# Patient Record
Sex: Male | Born: 1950 | Race: Black or African American | Hispanic: No | Marital: Married | Smoking: Current some day smoker
Health system: Southern US, Community
[De-identification: ages and names within clinical notes are randomized; demographics above are authoritative.]

## PROBLEM LIST (undated history)

## (undated) DIAGNOSIS — E785 Hyperlipidemia, unspecified: Secondary | ICD-10-CM

## (undated) HISTORY — PX: LIGAMENT REPAIR: SHX5444

## (undated) HISTORY — PX: FINGER SURGERY: SHX640

## (undated) HISTORY — PX: COLONOSCOPY: SHX174

---

## 2018-03-20 ENCOUNTER — Encounter (HOSPITAL_COMMUNITY): Payer: Self-pay | Admitting: *Deleted

## 2018-03-20 ENCOUNTER — Emergency Department (HOSPITAL_COMMUNITY): Payer: Medicare Other

## 2018-03-20 ENCOUNTER — Inpatient Hospital Stay (HOSPITAL_COMMUNITY)
Admission: EM | Admit: 2018-03-20 | Discharge: 2018-03-23 | DRG: 184 | Disposition: A | Discharge status: Home / Self Care | Payer: Medicare Other | Attending: Surgery | Admitting: Surgery

## 2018-03-20 ENCOUNTER — Other Ambulatory Visit: Payer: Self-pay

## 2018-03-20 DIAGNOSIS — T1490XA Injury, unspecified, initial encounter: Secondary | ICD-10-CM | POA: Diagnosis not present

## 2018-03-20 DIAGNOSIS — S39091A Other injury of muscle, fascia and tendon of abdomen, initial encounter: Secondary | ICD-10-CM | POA: Diagnosis present

## 2018-03-20 DIAGNOSIS — S42191A Fracture of other part of scapula, right shoulder, initial encounter for closed fracture: Secondary | ICD-10-CM | POA: Diagnosis present

## 2018-03-20 DIAGNOSIS — S2243XA Multiple fractures of ribs, bilateral, initial encounter for closed fracture: Principal | ICD-10-CM

## 2018-03-20 DIAGNOSIS — S32038A Other fracture of third lumbar vertebra, initial encounter for closed fracture: Secondary | ICD-10-CM | POA: Diagnosis present

## 2018-03-20 DIAGNOSIS — S2242XA Multiple fractures of ribs, left side, initial encounter for closed fracture: Secondary | ICD-10-CM

## 2018-03-20 DIAGNOSIS — S50811A Abrasion of right forearm, initial encounter: Secondary | ICD-10-CM | POA: Diagnosis present

## 2018-03-20 DIAGNOSIS — S32028A Other fracture of second lumbar vertebra, initial encounter for closed fracture: Secondary | ICD-10-CM | POA: Diagnosis present

## 2018-03-20 DIAGNOSIS — T07XXXA Unspecified multiple injuries, initial encounter: Secondary | ICD-10-CM

## 2018-03-20 DIAGNOSIS — F17210 Nicotine dependence, cigarettes, uncomplicated: Secondary | ICD-10-CM | POA: Diagnosis present

## 2018-03-20 DIAGNOSIS — S2249XA Multiple fractures of ribs, unspecified side, initial encounter for closed fracture: Secondary | ICD-10-CM | POA: Diagnosis present

## 2018-03-20 DIAGNOSIS — S0240CA Maxillary fracture, right side, initial encounter for closed fracture: Secondary | ICD-10-CM | POA: Diagnosis present

## 2018-03-20 DIAGNOSIS — S0081XA Abrasion of other part of head, initial encounter: Secondary | ICD-10-CM | POA: Diagnosis present

## 2018-03-20 DIAGNOSIS — S32018A Other fracture of first lumbar vertebra, initial encounter for closed fracture: Secondary | ICD-10-CM | POA: Diagnosis present

## 2018-03-20 DIAGNOSIS — S0231XA Fracture of orbital floor, right side, initial encounter for closed fracture: Secondary | ICD-10-CM | POA: Diagnosis present

## 2018-03-20 DIAGNOSIS — S0240EA Zygomatic fracture, right side, initial encounter for closed fracture: Secondary | ICD-10-CM | POA: Diagnosis present

## 2018-03-20 DIAGNOSIS — T797XXA Traumatic subcutaneous emphysema, initial encounter: Secondary | ICD-10-CM | POA: Diagnosis present

## 2018-03-20 HISTORY — DX: Hyperlipidemia, unspecified: E78.5

## 2018-03-20 LAB — COMPREHENSIVE METABOLIC PANEL
ALK PHOS: 56 U/L (ref 38–126)
ALT: 58 U/L — AB (ref 0–44)
AST: 107 U/L — AB (ref 15–41)
Albumin: 4 g/dL (ref 3.5–5.0)
Anion gap: 14 (ref 5–15)
BILIRUBIN TOTAL: 0.8 mg/dL (ref 0.3–1.2)
BUN: 12 mg/dL (ref 8–23)
CALCIUM: 9.5 mg/dL (ref 8.9–10.3)
CHLORIDE: 105 mmol/L (ref 98–111)
CO2: 21 mmol/L — ABNORMAL LOW (ref 22–32)
CREATININE: 1.33 mg/dL — AB (ref 0.61–1.24)
GFR calc non Af Amer: 53 mL/min — ABNORMAL LOW (ref >60)
Glucose, Bld: 124 mg/dL — ABNORMAL HIGH (ref 70–99)
Potassium: 3.4 mmol/L — ABNORMAL LOW (ref 3.5–5.1)
Sodium: 140 mmol/L (ref 135–145)
Total Protein: 7 g/dL (ref 6.5–8.1)

## 2018-03-20 LAB — I-STAT CG4 LACTIC ACID, ED: Lactic Acid, Venous: 3.88 mmol/L (ref 0.5–1.9)

## 2018-03-20 LAB — I-STAT CHEM 8, ED
BUN: 15 mg/dL (ref 8–23)
CHLORIDE: 105 mmol/L (ref 98–111)
CREATININE: 1.4 mg/dL — AB (ref 0.61–1.24)
Calcium, Ion: 1.11 mmol/L — ABNORMAL LOW (ref 1.15–1.40)
GLUCOSE: 118 mg/dL — AB (ref 70–99)
HEMATOCRIT: 42 % (ref 39.0–52.0)
HEMOGLOBIN: 14.3 g/dL (ref 13.0–17.0)
Potassium: 3.4 mmol/L — ABNORMAL LOW (ref 3.5–5.1)
Sodium: 139 mmol/L (ref 135–145)
TCO2: 21 mmol/L — ABNORMAL LOW (ref 22–32)

## 2018-03-20 LAB — CBC
HCT: 39.7 % (ref 39.0–52.0)
Hemoglobin: 13 g/dL (ref 13.0–17.0)
MCH: 31 pg (ref 26.0–34.0)
MCHC: 32.7 g/dL (ref 30.0–36.0)
MCV: 94.5 fL (ref 78.0–100.0)
PLATELETS: 188 10*3/uL (ref 150–400)
RBC: 4.2 MIL/uL — ABNORMAL LOW (ref 4.22–5.81)
RDW: 14.5 % (ref 11.5–15.5)
WBC: 9.2 10*3/uL (ref 4.0–10.5)

## 2018-03-20 LAB — SAMPLE TO BLOOD BANK

## 2018-03-20 LAB — PROTIME-INR
INR: 0.98
Prothrombin Time: 12.9 seconds (ref 11.4–15.2)

## 2018-03-20 LAB — CDS SEROLOGY

## 2018-03-20 LAB — ETHANOL: ALCOHOL ETHYL (B): 124 mg/dL — AB (ref <10)

## 2018-03-20 MED ORDER — FENTANYL CITRATE (PF) 100 MCG/2ML IJ SOLN
50.0000 ug | Freq: Once | INTRAMUSCULAR | Status: AC
  Administered 2018-03-20: 50 ug via INTRAVENOUS

## 2018-03-20 MED ORDER — LIDOCAINE-EPINEPHRINE (PF) 2 %-1:200000 IJ SOLN
20.0000 mL | Freq: Once | INTRAMUSCULAR | Status: AC
  Administered 2018-03-20: 20 mL

## 2018-03-20 MED ORDER — CEFAZOLIN SODIUM-DEXTROSE 2-4 GM/100ML-% IV SOLN
2.0000 g | Freq: Once | INTRAVENOUS | Status: AC
  Administered 2018-03-20: 2 g via INTRAVENOUS

## 2018-03-20 MED ORDER — FENTANYL CITRATE (PF) 100 MCG/2ML IJ SOLN
INTRAMUSCULAR | Status: AC
  Filled 2018-03-20: qty 2

## 2018-03-20 MED ORDER — IOPAMIDOL (ISOVUE-300) INJECTION 61%
100.0000 mL | Freq: Once | INTRAVENOUS | Status: AC | PRN
  Administered 2018-03-20: 100 mL via INTRAVENOUS

## 2018-03-20 MED ORDER — IOPAMIDOL (ISOVUE-300) INJECTION 61%
INTRAVENOUS | Status: AC
  Filled 2018-03-20: qty 100

## 2018-03-20 MED ORDER — SODIUM CHLORIDE 0.9 % IV SOLN
Freq: Once | INTRAVENOUS | Status: AC
  Administered 2018-03-20: 21:00:00 via INTRAVENOUS
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
## 2018-03-21 ENCOUNTER — Encounter (HOSPITAL_COMMUNITY): Payer: Self-pay | Admitting: General Practice

## 2018-03-21 ENCOUNTER — Other Ambulatory Visit: Payer: Self-pay

## 2018-03-21 ENCOUNTER — Inpatient Hospital Stay (HOSPITAL_COMMUNITY): Payer: Medicare Other

## 2018-03-21 DIAGNOSIS — S0081XA Abrasion of other part of head, initial encounter: Secondary | ICD-10-CM | POA: Diagnosis present

## 2018-03-21 DIAGNOSIS — S0240EA Zygomatic fracture, right side, initial encounter for closed fracture: Secondary | ICD-10-CM | POA: Diagnosis present

## 2018-03-21 DIAGNOSIS — S2249XA Multiple fractures of ribs, unspecified side, initial encounter for closed fracture: Secondary | ICD-10-CM | POA: Diagnosis present

## 2018-03-21 DIAGNOSIS — S50811A Abrasion of right forearm, initial encounter: Secondary | ICD-10-CM | POA: Diagnosis present

## 2018-03-21 DIAGNOSIS — S2243XA Multiple fractures of ribs, bilateral, initial encounter for closed fracture: Secondary | ICD-10-CM | POA: Diagnosis present

## 2018-03-21 DIAGNOSIS — T1490XA Injury, unspecified, initial encounter: Secondary | ICD-10-CM | POA: Diagnosis present

## 2018-03-21 DIAGNOSIS — S39091A Other injury of muscle, fascia and tendon of abdomen, initial encounter: Secondary | ICD-10-CM | POA: Diagnosis present

## 2018-03-21 DIAGNOSIS — S0231XA Fracture of orbital floor, right side, initial encounter for closed fracture: Secondary | ICD-10-CM | POA: Diagnosis present

## 2018-03-21 DIAGNOSIS — S0240CA Maxillary fracture, right side, initial encounter for closed fracture: Secondary | ICD-10-CM | POA: Diagnosis present

## 2018-03-21 DIAGNOSIS — T797XXA Traumatic subcutaneous emphysema, initial encounter: Secondary | ICD-10-CM | POA: Diagnosis present

## 2018-03-21 DIAGNOSIS — S42191A Fracture of other part of scapula, right shoulder, initial encounter for closed fracture: Secondary | ICD-10-CM | POA: Diagnosis present

## 2018-03-21 DIAGNOSIS — S32038A Other fracture of third lumbar vertebra, initial encounter for closed fracture: Secondary | ICD-10-CM | POA: Diagnosis present

## 2018-03-21 DIAGNOSIS — S32018A Other fracture of first lumbar vertebra, initial encounter for closed fracture: Secondary | ICD-10-CM | POA: Diagnosis present

## 2018-03-21 DIAGNOSIS — S32028A Other fracture of second lumbar vertebra, initial encounter for closed fracture: Secondary | ICD-10-CM | POA: Diagnosis present

## 2018-03-21 DIAGNOSIS — F17210 Nicotine dependence, cigarettes, uncomplicated: Secondary | ICD-10-CM | POA: Diagnosis present

## 2018-03-21 LAB — URINALYSIS, ROUTINE W REFLEX MICROSCOPIC
Bilirubin Urine: NEGATIVE
Glucose, UA: NEGATIVE mg/dL
Ketones, ur: NEGATIVE mg/dL
Leukocytes, UA: NEGATIVE
Nitrite: NEGATIVE
PROTEIN: NEGATIVE mg/dL
SPECIFIC GRAVITY, URINE: 1.032 — AB (ref 1.005–1.030)
pH: 5 (ref 5.0–8.0)

## 2018-03-21 LAB — CBC
HCT: 35.5 % — ABNORMAL LOW (ref 39.0–52.0)
Hemoglobin: 12.2 g/dL — ABNORMAL LOW (ref 13.0–17.0)
MCH: 31.4 pg (ref 26.0–34.0)
MCHC: 34.4 g/dL (ref 30.0–36.0)
MCV: 91.3 fL (ref 78.0–100.0)
PLATELETS: 217 10*3/uL (ref 150–400)
RBC: 3.89 MIL/uL — AB (ref 4.22–5.81)
RDW: 14.5 % (ref 11.5–15.5)
WBC: 8.5 10*3/uL (ref 4.0–10.5)

## 2018-03-21 LAB — BASIC METABOLIC PANEL
Anion gap: 13 (ref 5–15)
BUN: 11 mg/dL (ref 8–23)
CO2: 22 mmol/L (ref 22–32)
Calcium: 8.7 mg/dL — ABNORMAL LOW (ref 8.9–10.3)
Chloride: 102 mmol/L (ref 98–111)
Creatinine, Ser: 0.96 mg/dL (ref 0.61–1.24)
GFR calc Af Amer: >60 mL/min (ref >60)
GLUCOSE: 124 mg/dL — AB (ref 70–99)
POTASSIUM: 4.2 mmol/L (ref 3.5–5.1)
Sodium: 137 mmol/L (ref 135–145)

## 2018-03-21 LAB — HIV ANTIBODY (ROUTINE TESTING W REFLEX): HIV Screen 4th Generation wRfx: NONREACTIVE

## 2018-03-21 MED ORDER — KETOROLAC TROMETHAMINE 15 MG/ML IJ SOLN
15.0000 mg | Freq: Four times a day (QID) | INTRAMUSCULAR | Status: AC
  Administered 2018-03-21 – 2018-03-23 (×8): 15 mg via INTRAVENOUS
  Filled 2018-03-21 (×8): qty 1

## 2018-03-21 MED ORDER — CHLORPROMAZINE HCL 25 MG/ML IJ SOLN
50.0000 mg | Freq: Once | INTRAMUSCULAR | Status: AC
  Administered 2018-03-21: 50 mg via INTRAMUSCULAR
  Filled 2018-03-21: qty 2

## 2018-03-21 MED ORDER — ONDANSETRON HCL 4 MG/2ML IJ SOLN: 4.0000 mg | Freq: Four times a day (QID) | INTRAMUSCULAR | Status: DC | PRN

## 2018-03-21 MED ORDER — ACETAMINOPHEN 500 MG PO TABS
1000.0000 mg | ORAL_TABLET | Freq: Three times a day (TID) | ORAL | Status: DC
  Administered 2018-03-21 – 2018-03-23 (×7): 1000 mg via ORAL
  Filled 2018-03-21 (×7): qty 2

## 2018-03-21 MED ORDER — POTASSIUM CHLORIDE IN NACL 20-0.9 MEQ/L-% IV SOLN
INTRAVENOUS | Status: DC
  Administered 2018-03-21 (×3): via INTRAVENOUS
  Filled 2018-03-21 (×4): qty 1000

## 2018-03-21 MED ORDER — HYDROMORPHONE HCL 1 MG/ML IJ SOLN: 1.0000 mg | INTRAMUSCULAR | Status: DC | PRN

## 2018-03-21 MED ORDER — DOCUSATE SODIUM 100 MG PO CAPS
100.0000 mg | ORAL_CAPSULE | Freq: Two times a day (BID) | ORAL | Status: DC
  Administered 2018-03-21 – 2018-03-23 (×5): 100 mg via ORAL
  Filled 2018-03-21 (×6): qty 1

## 2018-03-21 MED ORDER — METHOCARBAMOL 500 MG PO TABS
500.0000 mg | ORAL_TABLET | Freq: Three times a day (TID) | ORAL | Status: DC
  Administered 2018-03-21 – 2018-03-23 (×7): 500 mg via ORAL
  Filled 2018-03-21 (×7): qty 1

## 2018-03-21 MED ORDER — ATORVASTATIN CALCIUM 40 MG PO TABS
40.0000 mg | ORAL_TABLET | Freq: Every day | ORAL | Status: DC
  Administered 2018-03-21 – 2018-03-22 (×2): 40 mg via ORAL
  Filled 2018-03-21 (×2): qty 1

## 2018-03-21 MED ORDER — OXYCODONE HCL 5 MG PO TABS
10.0000 mg | ORAL_TABLET | ORAL | Status: DC | PRN
  Administered 2018-03-21 – 2018-03-23 (×6): 10 mg via ORAL
  Filled 2018-03-21 (×6): qty 2

## 2018-03-21 MED ORDER — OXYCODONE HCL 5 MG PO TABS: 5.0000 mg | ORAL_TABLET | ORAL | Status: DC | PRN

## 2018-03-21 MED ORDER — KETOROLAC TROMETHAMINE 30 MG/ML IJ SOLN
30.0000 mg | Freq: Once | INTRAMUSCULAR | Status: AC
  Administered 2018-03-21: 30 mg via INTRAVENOUS
  Filled 2018-03-21: qty 1

## 2018-03-21 MED ORDER — BACITRACIN ZINC 500 UNIT/GM EX OINT
TOPICAL_OINTMENT | Freq: Two times a day (BID) | CUTANEOUS | Status: DC
  Administered 2018-03-21 – 2018-03-23 (×6): 31.5556 via TOPICAL
  Filled 2018-03-21: qty 28.35

## 2018-03-21 MED ORDER — ONDANSETRON 4 MG PO TBDP: 4.0000 mg | ORAL_TABLET | Freq: Four times a day (QID) | ORAL | Status: DC | PRN

## 2018-03-21 MED ORDER — ACETAMINOPHEN 325 MG PO TABS: 650.0000 mg | ORAL_TABLET | ORAL | Status: DC | PRN

## 2018-03-21 MED ORDER — HYDROMORPHONE HCL 1 MG/ML IJ SOLN
1.0000 mg | INTRAMUSCULAR | Status: DC | PRN
  Administered 2018-03-22: 1 mg via INTRAVENOUS
  Filled 2018-03-21: qty 1

## 2018-03-21 NOTE — Consult Note (Signed)
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 source Temporal, resp. rate (!) 21, height '5\' 10"'$  (1.778 m), weight 74.8 kg, SpO2 92 %. Physical Exam Lip abrasion. Talkative and polite. A&O Chest tender, abrasions LUEx shoulder, elbow, wrist, digits- no skin wounds, nontender, no instability, no blocks to motion  Sens  Ax/R/M/U intact  Mot   Ax/ R/ PIN/ M/ AIN/ U intact  Rad 2+  RUEx   Multiple abrasions with some full thickness skin loss over the radial aspect of the wrist  shoulder, elbow, wrist, digits- no instability, no blocks to motion  Sens  Ax/R/M/U intact  Mot   Ax/ R/ PIN/ M/ AIN/ U intact Pelvis tender but mildly so, abrasions; no instability or pain w axial loading LLE No traumatic wounds, ecchymosis, or rash  Nontender  No knee or ankle effusion  Knee stable to varus/ valgus and anterior/posterior stress  Sens DPN, SPN, TN intact  Motor EHL, ext, flex, evers 5/5  DP 2+, PT 2+, No significant edema RLE No traumatic wounds, ecchymosis, or rash  Nontender  No knee or ankle effusion  Knee stable to varus/ valgus and anterior/posterior stress  Sens DPN, SPN, TN intact  Motor EHL, ext, flex, evers 5/5  DP 2+, PT 2+, No significant  edema  Assessment/Plan: Hairline fracture of right scapula--does not require sling Fracture of pelvis avulsion--WBAT without restriction RUEX skin loss and abrasions Dr. Amedeo Plenty has seen and treated with plans for follow up  Please re-consult if any concerns.  Altamese [place], MD [facility], [facility] [phone]  03/21/2018, 12:05 AM

## 2018-03-21 NOTE — Consult Note (Signed)
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 rate (!) 21, height '5\' 10"'$  (1.778 m), weight 74.8 kg, SpO2 92 %. Physical Exam Skin avulsion injury about the forearm and hand he has an area 1 inch in circumference about the distal radial styloid region with exposed subcutaneous tissue and no deep penetration into the joint and certainly no tendon exposed at this time. I performed a debridement obvious area without difficulty. Skin is obtained tissue was debrided and following this Adaptic/Mepitel was placed without difficulty.  There is no deep penetration into the joint or other issue going on here. I would recommend wound care.  I discussed with the patient all issues.  Opposite extremity is stable. Assessment/Plan: Skin avulsion and injury right forearm will plan for careful cautious observatory care and daily dressing changes. I feel this will likely heal in in another itself we simply need to give this time and patience. If he goes on the poor healing skin grafting to be considered but I would not rush to this at this point.  I discussed this with him his family and consultants.  Satira Anis [name] 03/21/2018, 12:21 AM
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
## 2018-03-21 NOTE — Progress Notes (Signed)
0115 received pt from ED. A&O x4, complains of pain 9/10 after moving to floor bed but immediately fell back to sleep after initial exam complete and lights reduced. Will continue to monitor.
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
## 2018-03-21 NOTE — Progress Notes (Signed)
Patient ID: [name], male   DOB: [birth year], 67 y.o.   MRN: [id number] Patient is stable We will begin PT hydrotherapy on Thursday We will allow secondary intention healing for his forearm. We will monitor for any necrosis.  If he worsens certainly skin grafting or other aggressive measures can be considered but for now I feel that a conservative approach is all that is needed.  Arleatha Philipps MD

## 2018-03-21 NOTE — ED Notes (Signed)
ENT at bedside

## 2018-03-22 LAB — CBC
HCT: 28.8 % — ABNORMAL LOW (ref 39.0–52.0)
Hemoglobin: 9.8 g/dL — ABNORMAL LOW (ref 13.0–17.0)
MCH: 31.2 pg (ref 26.0–34.0)
MCHC: 34 g/dL (ref 30.0–36.0)
MCV: 91.7 fL (ref 78.0–100.0)
Platelets: 158 10*3/uL (ref 150–400)
RBC: 3.14 MIL/uL — ABNORMAL LOW (ref 4.22–5.81)
RDW: 14.5 % (ref 11.5–15.5)
WBC: 6.5 10*3/uL (ref 4.0–10.5)

## 2018-03-22 LAB — BASIC METABOLIC PANEL
Anion gap: 7 (ref 5–15)
BUN: 11 mg/dL (ref 8–23)
CALCIUM: 8.4 mg/dL — AB (ref 8.9–10.3)
CO2: 22 mmol/L (ref 22–32)
CREATININE: 0.93 mg/dL (ref 0.61–1.24)
Chloride: 111 mmol/L (ref 98–111)
GFR calc non Af Amer: >60 mL/min (ref >60)
Glucose, Bld: 99 mg/dL (ref 70–99)
Potassium: 4.3 mmol/L (ref 3.5–5.1)
SODIUM: 140 mmol/L (ref 135–145)

## 2018-03-22 MED ORDER — POLYETHYLENE GLYCOL 3350 17 G PO PACK: 17.0000 g | PACK | Freq: Every day | ORAL | Status: DC | PRN

## 2018-03-22 MED ORDER — FERROUS GLUCONATE 324 (38 FE) MG PO TABS
324.0000 mg | ORAL_TABLET | Freq: Every day | ORAL | Status: DC
  Administered 2018-03-22 – 2018-03-23 (×2): 324 mg via ORAL
  Filled 2018-03-22 (×2): qty 1

## 2018-03-22 MED ORDER — TAB-A-VITE/IRON PO TABS
1.0000 | ORAL_TABLET | Freq: Every day | ORAL | Status: DC
  Administered 2018-03-22 – 2018-03-23 (×2): 1 via ORAL
  Filled 2018-03-22 (×2): qty 1
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
## 2018-03-22 NOTE — Plan of Care (Signed)
  Problem: Pain Managment: Goal: General experience of comfort will improve Outcome: Progressing   Problem: Safety: Goal: Ability to remain free from injury will improve Outcome: Progressing   Problem: Skin Integrity: Goal: Risk for impaired skin integrity will decrease Outcome: Progressing   

## 2018-03-22 NOTE — Progress Notes (Signed)
Patient ID: Babs BertinLevern Diggins, male   DOB: [birth year], 67 y.o.   MRN: [id number] Begin dressing changes and PT hydrotherapy today for his arm.  We will see IV progresses in the next few days.  Continue range of motion elevation and edema control efforts.  Shonteria Abeln MD
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
## 2018-03-23 LAB — CBC
HCT: 31.1 % — ABNORMAL LOW (ref 39.0–52.0)
Hemoglobin: 10.4 g/dL — ABNORMAL LOW (ref 13.0–17.0)
MCH: 31 pg (ref 26.0–34.0)
MCHC: 33.4 g/dL (ref 30.0–36.0)
MCV: 92.8 fL (ref 78.0–100.0)
Platelets: 180 10*3/uL (ref 150–400)
RBC: 3.35 MIL/uL — ABNORMAL LOW (ref 4.22–5.81)
RDW: 14.4 % (ref 11.5–15.5)
WBC: 5.7 10*3/uL (ref 4.0–10.5)

## 2018-03-23 MED ORDER — DOCUSATE SODIUM 100 MG PO CAPS: 100.0000 mg | ORAL_CAPSULE | Freq: Two times a day (BID) | ORAL | 0 refills | Status: AC

## 2018-03-23 MED ORDER — BACITRACIN ZINC 500 UNIT/GM EX OINT: TOPICAL_OINTMENT | Freq: Two times a day (BID) | CUTANEOUS | 0 refills | Status: DC

## 2018-03-23 MED ORDER — OXYCODONE HCL 10 MG PO TABS: 5.0000 mg | ORAL_TABLET | Freq: Four times a day (QID) | ORAL | 0 refills | Status: DC | PRN

## 2018-03-23 MED ORDER — METHOCARBAMOL 500 MG PO TABS: 500.0000 mg | ORAL_TABLET | Freq: Three times a day (TID) | ORAL | 0 refills | Status: DC | PRN

## 2018-03-23 MED ORDER — ACETAMINOPHEN 500 MG PO TABS: 1000.0000 mg | ORAL_TABLET | Freq: Three times a day (TID) | ORAL | 0 refills | Status: DC | PRN

## 2018-03-23 MED ORDER — POLYETHYLENE GLYCOL 3350 17 G PO PACK: 17.0000 g | PACK | Freq: Every day | ORAL | 0 refills | Status: DC | PRN

## 2018-03-23 MED ORDER — TAB-A-VITE/IRON PO TABS: 1.0000 | ORAL_TABLET | Freq: Every day | ORAL | 0 refills | Status: DC

## 2018-03-23 MED ORDER — FERROUS GLUCONATE 324 (38 FE) MG PO TABS: 324.0000 mg | ORAL_TABLET | Freq: Every day | ORAL | 0 refills | Status: DC
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
## 2018-03-23 NOTE — Progress Notes (Signed)
Physical Therapy Wound Treatment Patient Details  Name: [name] MRN: [id number] Date of Birth: Jul 18, 1950  Today's Date: 03/23/2018 Time: [id number] Time Calculation (min): 36 min  Subjective  Subjective: pt pleasant and agreeable to hydrotherapy Patient and Family Stated Goals: Heal wounds Date of Onset: 03/20/18  Pain Score:  Pt tolerated treatment well without reports of pain  Wound Assessment  Wound / Incision (Open or Dehisced) 03/21/18 Non-pressure wound Arm Right RFA skin avulsion injury (Active)  Dressing Type Compression wrap;Gauze (Comment);Moist to dry 03/23/2018  9:32 AM  Dressing Changed Changed 03/23/2018  9:32 AM  Dressing Status Clean;Dry;Intact 03/23/2018  9:32 AM  Dressing Change Frequency Daily 03/23/2018  9:32 AM  Site / Wound Assessment Red 03/23/2018  9:32 AM  % Wound base Red or Granulating 100% 03/23/2018  9:32 AM  % Wound base Yellow/Fibrinous Exudate 0% 03/23/2018  9:32 AM  % Wound base Black/Eschar 0% 03/23/2018  9:32 AM  % Wound base Other/Granulation Tissue (Comment) 0% 03/23/2018  9:32 AM  Peri-wound Assessment Intact 03/23/2018  9:32 AM  Wound Length (cm) 30 cm 03/22/2018  1:03 PM  Wound Width (cm) 5.1 cm 03/22/2018  1:03 PM  Wound Depth (cm) 0.2 cm 03/22/2018  1:03 PM  Wound Volume (cm^3) 30.6 cm^3 03/22/2018  1:03 PM  Wound Surface Area (cm^2) 153 cm^2 03/22/2018  1:03 PM  Margins Unattached edges (unapproximated) 03/23/2018  9:32 AM  Closure None 03/23/2018  9:32 AM  Drainage Amount Scant 03/23/2018  9:32 AM  Drainage Description Serous 03/23/2018  9:32 AM  Treatment Hydrotherapy (Pulse lavage);Packing (Saline gauze) 03/23/2018  9:32 AM  Hydrotherapy Pulsed lavage therapy - wound location: R forearm and wrist Pulsed Lavage with Suction (psi): 4 psi Pulsed Lavage with Suction - Normal Saline Used: 1000 mL Pulsed Lavage Tip: Tip with splash shield   Wound Assessment and Plan  Wound Therapy - Assess/Plan/Recommendations Wound Therapy - Clinical Statement: Pt  reports minimal pain today with dressing change and wounds appear 100% clean. Family members present and pt/family educated on dressing changes and wound care at home. Written HEP and dressing change instructions provided at end of session. Will sign off at this time. If needs change, please reconsult.  Wound Therapy - Functional Problem List: Decreased AROM and acute pain consistent with mode of injury Hydrotherapy Plan: Debridement;Dressing change;Patient/family education;Pulsatile lavage with suction Wound Therapy - Frequency: 6X / week Wound Therapy - Follow Up Recommendations: Other (comment)(Per MD) Wound Plan: See above  Wound Therapy Goals- Improve the function of patient's integumentary system by progressing the wound(s) through the phases of wound healing (inflammation - proliferation - remodeling) by: Patient/Family will be able to : demonstrate dressing changes independently Patient/Family Instruction Goal - Progress: Met Goals/treatment plan/discharge plan were made with and agreed upon by patient/family: Yes Time For Goal Achievement: 7 days Wound Therapy - Potential for Goals: Excellent  Goals will be updated until maximal potential achieved or discharge criteria met.  Discharge criteria: when goals achieved, discharge from hospital, MD decision/surgical intervention, no progress towards goals, refusal/missing three consecutive treatments without notification or medical reason.  GP     Thelma Comp 03/23/2018, 10:39 AM   Rolinda Roan, PT, DPT Acute Rehabilitation Services Pager: [phone] Office: [phone]

## 2018-03-23 NOTE — Plan of Care (Signed)
°  Problem: Clinical Measurements: °Goal: Ability to maintain clinical measurements within normal limits will improve °Outcome: Progressing °  °Problem: Activity: °Goal: Risk for activity intolerance will decrease °Outcome: Progressing °  °Problem: Nutrition: °Goal: Adequate nutrition will be maintained °Outcome: Progressing °  °
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
## 2018-03-23 NOTE — Care Management Important Message (Signed)
Important Message  Patient Details  Name: Raymond BertinLevern Frazier MRN: [id number] Date of Birth: Sep 10, 1950   Medicare Important Message Given:  Yes    Park Beck Stefan ChurchBratton 03/23/2018, 4:08 PM
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
## 2018-03-23 NOTE — Progress Notes (Signed)
Pt discharged home with wife in stable condition after going over discharge instructions with no concerns voiced. AVS and discharge scripts given before leaving unit

## 2018-03-23 NOTE — Progress Notes (Signed)
CSW met with patient via bedside to complete SBIRT- patient was appropriate and in good spirits during conversation. Patient currently lives in [place] with his spouse. PT recommended no PT follow up at this time- patient states he does very well on his feet and does not think he needs any assistance at home. Patient states he is ready to leave the hospital today. Patient voices no concerns for alcohol or substance abuse at this time. SBIRT completed in flowsheets.   Kingsley Spittle, [place]  [phone]
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
## 2018-05-17 NOTE — Pre-Procedure Instructions (Signed)
Sayed Apostol  05/17/2018      [facility] [place] [address], [place] - [address] [place] [address] Phone: [phone] Fax: [phone]    Your procedure is scheduled on Tuesday November 12th.  Report to [facility] Admitting at 0600 A.M.  Call this number if you have problems the morning of surgery:  [phone]   Remember:  Do not eat or drink after midnight.    Take these medicines the morning of surgery with A SIP OF WATER   acetaminophen (TYLENOL)  If needed  docusate sodium (COLACE)   methocarbamol (ROBAXIN) if needed  Oxycodone HCl if needed   7 days prior to surgery STOP taking any Aspirin(unless otherwise instructed by your surgeon), Aleve, Naproxen, Ibuprofen, Motrin, Advil, Goody's, BC's, all herbal medications, fish oil, and all vitamins   Do not wear jewelry.  Do not wear lotions, powders, or colognes, or deodorant.  Men may shave face and neck.  Do not bring valuables to the hospital.  [facility] is not responsible for any belongings or valuables.  Contacts, dentures or bridgework may not be worn into surgery.  Leave your suitcase in the car.  After surgery it may be brought to your room.  For patients admitted to the hospital, discharge time will be determined by your treatment team.  Patients discharged the day of surgery will not be allowed to drive home.    [place]- Preparing For Surgery  Before surgery, you can play an important role. Because skin is not sterile, your skin needs to be as free of germs as possible. You can reduce the number of germs on your skin by washing with CHG (chlorahexidine gluconate) Soap before surgery.  CHG is an antiseptic cleaner which kills germs and bonds with the skin to continue killing germs even after washing.    Oral Hygiene is also important to reduce your risk of infection.  Remember - BRUSH YOUR TEETH THE MORNING OF SURGERY WITH YOUR REGULAR  TOOTHPASTE  Please do not use if you have an allergy to CHG or antibacterial soaps. If your skin becomes reddened/irritated stop using the CHG.  Do not shave (including legs and underarms) for at least 48 hours prior to first CHG shower. It is OK to shave your face.  Please follow these instructions carefully.   1. Shower the NIGHT BEFORE SURGERY and the MORNING OF SURGERY with CHG.   2. If you chose to wash your hair, wash your hair first as usual with your normal shampoo.  3. After you shampoo, rinse your hair and body thoroughly to remove the shampoo.  4. Use CHG as you would any other liquid soap. You can apply CHG directly to the skin and wash gently with a scrungie or a clean washcloth.   5. Apply the CHG Soap to your body ONLY FROM THE NECK DOWN.  Do not use on open wounds or open sores. Avoid contact with your eyes, ears, mouth and genitals (private parts). Wash Face and genitals (private parts)  with your normal soap.  6. Wash thoroughly, paying special attention to the area where your surgery will be performed.  7. Thoroughly rinse your body with warm water from the neck down.  8. DO NOT shower/wash with your normal soap after using and rinsing off the CHG Soap.  9. Pat yourself dry with a CLEAN TOWEL.  10. Wear CLEAN PAJAMAS to bed the night before  surgery, wear comfortable clothes the morning of surgery  11. Place CLEAN SHEETS on your bed the night of your first shower and DO NOT SLEEP WITH PETS.    Day of Surgery:  Do not apply any deodorants/lotions.  Please wear clean clothes to the hospital/surgery center.   Remember to brush your teeth WITH YOUR REGULAR TOOTHPASTE.    Please read over the following fact sheets that you were given.

## 2018-05-18 ENCOUNTER — Other Ambulatory Visit: Payer: Self-pay

## 2018-05-18 ENCOUNTER — Encounter (HOSPITAL_COMMUNITY)
Admission: RE | Admit: 2018-05-18 | Discharge: 2018-05-18 | Disposition: A | Discharge status: Home / Self Care | Payer: Medicare Other | Source: Ambulatory Visit | Attending: Orthopedic Surgery | Admitting: Orthopedic Surgery

## 2018-05-18 ENCOUNTER — Encounter (HOSPITAL_COMMUNITY): Payer: Self-pay

## 2018-05-18 DIAGNOSIS — Z01812 Encounter for preprocedural laboratory examination: Secondary | ICD-10-CM | POA: Insufficient documentation

## 2018-05-18 LAB — CBC
HEMATOCRIT: 39 % (ref 39.0–52.0)
HEMOGLOBIN: 12.1 g/dL — AB (ref 13.0–17.0)
MCH: 28.9 pg (ref 26.0–34.0)
MCHC: 31 g/dL (ref 30.0–36.0)
MCV: 93.1 fL (ref 80.0–100.0)
NRBC: 0 % (ref 0.0–0.2)
PLATELETS: 302 10*3/uL (ref 150–400)
RBC: 4.19 MIL/uL — AB (ref 4.22–5.81)
RDW: 14.6 % (ref 11.5–15.5)
WBC: 6.4 10*3/uL (ref 4.0–10.5)

## 2018-05-18 LAB — BASIC METABOLIC PANEL
ANION GAP: 5 (ref 5–15)
BUN: 9 mg/dL (ref 8–23)
CO2: 26 mmol/L (ref 22–32)
Calcium: 9.3 mg/dL (ref 8.9–10.3)
Chloride: 106 mmol/L (ref 98–111)
Creatinine, Ser: 1.06 mg/dL (ref 0.61–1.24)
GFR calc non Af Amer: >60 mL/min (ref >60)
Glucose, Bld: 101 mg/dL — ABNORMAL HIGH (ref 70–99)
Potassium: 3.5 mmol/L (ref 3.5–5.1)
Sodium: 137 mmol/L (ref 135–145)

## 2018-05-18 LAB — SURGICAL PCR SCREEN
MRSA, PCR: NEGATIVE
Staphylococcus aureus: NEGATIVE

## 2018-05-18 NOTE — Progress Notes (Signed)
[name]            05/18/2018                          [facility] [place] - [place], [place] - [address] [place] [address] Phone: [phone] Fax: [phone]              Your procedure is scheduled on Tues., Nov. 12, 2019 from 8:00AM-10:30AM            Report to [facility] Admitting Entrance "A" at 6:00AM            Call this number if you have problems the morning of surgery:            [phone]             Remember:            Do not eat or drink after midnight.                        Take these medicines the morning of surgery with A SIP OF WATER: NONE                         As of today, stop taking any Aspirin(unless otherwise instructed by your surgeon), Aleve, Naproxen, Ibuprofen, Motrin, Advil, Goody's, BC's, all herbal medications, fish oil, and all vitamins             Do not wear jewelry.            Do not wear lotions, powders, or colognes, or deodorant.            Do not shave the body 48 hours before your surgery. Men may shave face.            Do not bring valuables to the hospital.            [facility] is not responsible for any belongings or valuables.  Contacts, dentures or bridgework may not be worn into surgery.  Leave your suitcase in the car.  After surgery it may be brought to your room.  For patients admitted to the hospital, discharge time will be determined by your treatment team.  Patients discharged the day of surgery will not be allowed to drive home.    - Preparing For Surgery  Before surgery, you can play an important role. Because skin is not sterile, your skin needs to be as free of germs as possible. You can reduce the number of germs on your skin by washing with CHG (chlorahexidine gluconate) Soap before surgery.  CHG is an antiseptic cleaner which kills germs and bonds with the skin to continue killing germs even after washing.    Oral Hygiene is also  important to reduce your risk of infection.  Remember - BRUSH YOUR TEETH THE MORNING OF SURGERY WITH YOUR REGULAR TOOTHPASTE  Please do not use if you have an allergy to CHG or antibacterial soaps. If your skin becomes reddened/irritated stop using the CHG.  Do not shave (including legs and underarms) for at least 48 hours prior to first CHG shower. It is OK to shave your face.  Please follow these instructions carefully.  1. Shower the NIGHT BEFORE SURGERY and the MORNING OF SURGERY with CHG.   2. If you chose to wash your hair, wash your hair first as usual with your normal shampoo.  3. After you shampoo, rinse your hair and body thoroughly to remove the shampoo.  4. Use CHG as you would any other liquid soap. You can apply CHG directly to the skin and wash gently with a scrungie or a clean washcloth.   5. Apply the CHG Soap to your body ONLY FROM THE NECK DOWN.  Do not use on open wounds or open sores. Avoid contact with your eyes, ears, mouth and genitals (private parts). Wash Face and genitals (private parts)  with your normal soap.  6. Wash thoroughly, paying special attention to the area where your surgery will be performed.  7. Thoroughly rinse your body with warm water from the neck down.  8. DO NOT shower/wash with your normal soap after using and rinsing off the CHG Soap.  9. Pat yourself dry with a CLEAN TOWEL.  10. Wear CLEAN PAJAMAS to bed the night before surgery, wear comfortable clothes the morning of surgery  11. Place CLEAN SHEETS on your bed the night of your first shower and DO NOT SLEEP WITH PETS.  Day of Surgery:  Do not apply any deodorants/lotions.  Please wear clean clothes to the hospital/surgery center.   Remember to brush your teeth WITH YOUR REGULAR TOOTHPASTE.   Please read over the following fact sheets that you were  given.

## 2018-05-18 NOTE — Progress Notes (Signed)
PCP - Sandre Kitty  Cardiologist - Denies  Chest x-ray - 03/21/18 (E)  EKG - Denies  Stress Test - Denies  ECHO - Denies  Cardiac Cath - Denies  AICD- na PM- na LOOP- na  Sleep Study - Denies CPAP - None  LABS- 05/18/18: CBC, BMP  ASA- Denies  Anesthesia- No  Pt denies having chest pain, sob, or fever at this time. All instructions explained to the pt, with a verbal understanding of the material. Pt agrees to go over the instructions while at home for a better understanding. The opportunity to ask questions was provided.
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
## 2018-05-31 ENCOUNTER — Ambulatory Visit (HOSPITAL_COMMUNITY): Payer: Medicare Other | Admitting: Anesthesiology

## 2018-05-31 ENCOUNTER — Encounter (HOSPITAL_COMMUNITY): Payer: Self-pay | Admitting: General Practice

## 2018-05-31 ENCOUNTER — Ambulatory Visit (HOSPITAL_COMMUNITY): Payer: Medicare Other

## 2018-05-31 ENCOUNTER — Ambulatory Visit (HOSPITAL_COMMUNITY)
Admission: RE | Admit: 2018-05-31 | Discharge: 2018-05-31 | Disposition: A | Discharge status: Home / Self Care | Payer: Medicare Other | Source: Ambulatory Visit | Attending: Orthopedic Surgery | Admitting: Orthopedic Surgery

## 2018-05-31 ENCOUNTER — Encounter (HOSPITAL_COMMUNITY)
Admission: RE | Disposition: A | Discharge status: Home / Self Care | Payer: Self-pay | Source: Ambulatory Visit | Attending: Orthopedic Surgery

## 2018-05-31 DIAGNOSIS — Z419 Encounter for procedure for purposes other than remedying health state, unspecified: Secondary | ICD-10-CM

## 2018-05-31 DIAGNOSIS — Y9389 Activity, other specified: Secondary | ICD-10-CM | POA: Insufficient documentation

## 2018-05-31 DIAGNOSIS — S43101A Unspecified dislocation of right acromioclavicular joint, initial encounter: Secondary | ICD-10-CM | POA: Diagnosis not present

## 2018-05-31 DIAGNOSIS — Z79899 Other long term (current) drug therapy: Secondary | ICD-10-CM | POA: Insufficient documentation

## 2018-05-31 DIAGNOSIS — E785 Hyperlipidemia, unspecified: Secondary | ICD-10-CM | POA: Diagnosis not present

## 2018-05-31 DIAGNOSIS — S42001A Fracture of unspecified part of right clavicle, initial encounter for closed fracture: Secondary | ICD-10-CM | POA: Insufficient documentation

## 2018-05-31 DIAGNOSIS — M25311 Other instability, right shoulder: Secondary | ICD-10-CM | POA: Diagnosis present

## 2018-05-31 DIAGNOSIS — M25711 Osteophyte, right shoulder: Secondary | ICD-10-CM | POA: Diagnosis not present

## 2018-05-31 DIAGNOSIS — I739 Peripheral vascular disease, unspecified: Secondary | ICD-10-CM | POA: Diagnosis not present

## 2018-05-31 DIAGNOSIS — M19011 Primary osteoarthritis, right shoulder: Secondary | ICD-10-CM | POA: Insufficient documentation

## 2018-05-31 DIAGNOSIS — F17210 Nicotine dependence, cigarettes, uncomplicated: Secondary | ICD-10-CM | POA: Insufficient documentation

## 2018-05-31 HISTORY — PX: RESECTION DISTAL CLAVICAL: SHX5053

## 2018-05-31 HISTORY — PX: ACROMIO-CLAVICULAR JOINT REPAIR: SHX5183

## 2018-05-31 SURGERY — REPAIR, ACROMIOCLAVICULAR JOINT
Anesthesia: Regional | Site: Shoulder | Laterality: Right

## 2018-05-31 MED ORDER — SUGAMMADEX SODIUM 200 MG/2ML IV SOLN
INTRAVENOUS | Status: AC
  Filled 2018-05-31: qty 2

## 2018-05-31 MED ORDER — ONDANSETRON HCL 4 MG/2ML IJ SOLN
INTRAMUSCULAR | Status: AC
  Filled 2018-05-31: qty 2

## 2018-05-31 MED ORDER — DEXAMETHASONE SODIUM PHOSPHATE 10 MG/ML IJ SOLN
INTRAMUSCULAR | Status: DC | PRN
  Administered 2018-05-31: 10 mg via INTRAVENOUS

## 2018-05-31 MED ORDER — FENTANYL CITRATE (PF) 100 MCG/2ML IJ SOLN
INTRAMUSCULAR | Status: AC
  Administered 2018-05-31: 50 ug via INTRAVENOUS
  Filled 2018-05-31: qty 2

## 2018-05-31 MED ORDER — CEFAZOLIN SODIUM-DEXTROSE 2-4 GM/100ML-% IV SOLN
2.0000 g | INTRAVENOUS | Status: AC
  Administered 2018-05-31: 2 g via INTRAVENOUS

## 2018-05-31 MED ORDER — MIDAZOLAM HCL 2 MG/2ML IJ SOLN
INTRAMUSCULAR | Status: AC
  Administered 2018-05-31: 2 mg via INTRAVENOUS
  Filled 2018-05-31: qty 2

## 2018-05-31 MED ORDER — OXYCODONE-ACETAMINOPHEN 5-325 MG PO TABS: 1.0000 | ORAL_TABLET | ORAL | 0 refills | Status: AC | PRN

## 2018-05-31 MED ORDER — ARTIFICIAL TEARS OPHTHALMIC OINT
TOPICAL_OINTMENT | OPHTHALMIC | Status: AC
  Filled 2018-05-31: qty 3.5

## 2018-05-31 MED ORDER — SUGAMMADEX SODIUM 200 MG/2ML IV SOLN
INTRAVENOUS | Status: DC | PRN
  Administered 2018-05-31: 150 mg via INTRAVENOUS

## 2018-05-31 MED ORDER — BUPIVACAINE HCL (PF) 0.5 % IJ SOLN
INTRAMUSCULAR | Status: DC | PRN
  Administered 2018-05-31: 15 mL via PERINEURAL
  Administered 2018-05-31: 10 mL

## 2018-05-31 MED ORDER — FENTANYL CITRATE (PF) 100 MCG/2ML IJ SOLN: 25.0000 ug | INTRAMUSCULAR | Status: DC | PRN

## 2018-05-31 MED ORDER — ROCURONIUM BROMIDE 50 MG/5ML IV SOSY
PREFILLED_SYRINGE | INTRAVENOUS | Status: DC | PRN
  Administered 2018-05-31: 50 mg via INTRAVENOUS

## 2018-05-31 MED ORDER — ROCURONIUM BROMIDE 50 MG/5ML IV SOSY
PREFILLED_SYRINGE | INTRAVENOUS | Status: AC
  Filled 2018-05-31: qty 5

## 2018-05-31 MED ORDER — MIDAZOLAM HCL 2 MG/2ML IJ SOLN
INTRAMUSCULAR | Status: AC
  Filled 2018-05-31: qty 2

## 2018-05-31 MED ORDER — ONDANSETRON HCL 4 MG/2ML IJ SOLN
INTRAMUSCULAR | Status: DC | PRN
  Administered 2018-05-31: 4 mg via INTRAVENOUS

## 2018-05-31 MED ORDER — ACETAMINOPHEN 10 MG/ML IV SOLN: 1000.0000 mg | Freq: Once | INTRAVENOUS | Status: DC | PRN

## 2018-05-31 MED ORDER — PROPOFOL 10 MG/ML IV BOLUS
INTRAVENOUS | Status: AC
  Filled 2018-05-31: qty 40

## 2018-05-31 MED ORDER — FENTANYL CITRATE (PF) 100 MCG/2ML IJ SOLN
50.0000 ug | Freq: Once | INTRAMUSCULAR | Status: AC
  Administered 2018-05-31: 50 ug via INTRAVENOUS

## 2018-05-31 MED ORDER — LACTATED RINGERS IV SOLN: INTRAVENOUS | Status: DC | PRN

## 2018-05-31 MED ORDER — BUPIVACAINE LIPOSOME 1.3 % IJ SUSP
INTRAMUSCULAR | Status: DC | PRN
  Administered 2018-05-31: 133 mg via PERINEURAL

## 2018-05-31 MED ORDER — DEXAMETHASONE SODIUM PHOSPHATE 10 MG/ML IJ SOLN
INTRAMUSCULAR | Status: AC
  Filled 2018-05-31: qty 1

## 2018-05-31 MED ORDER — MIDAZOLAM HCL 2 MG/2ML IJ SOLN
2.0000 mg | Freq: Once | INTRAMUSCULAR | Status: AC
  Administered 2018-05-31: 2 mg via INTRAVENOUS

## 2018-05-31 MED ORDER — FENTANYL CITRATE (PF) 250 MCG/5ML IJ SOLN
INTRAMUSCULAR | Status: AC
  Filled 2018-05-31: qty 5

## 2018-05-31 MED ORDER — ACETAMINOPHEN 500 MG PO TABS: 1000.0000 mg | ORAL_TABLET | Freq: Once | ORAL | Status: DC | PRN

## 2018-05-31 MED ORDER — 0.9 % SODIUM CHLORIDE (POUR BTL) OPTIME
TOPICAL | Status: DC | PRN
  Administered 2018-05-31: 1000 mL

## 2018-05-31 MED ORDER — CEFAZOLIN SODIUM-DEXTROSE 2-4 GM/100ML-% IV SOLN
INTRAVENOUS | Status: AC
  Filled 2018-05-31: qty 100

## 2018-05-31 MED ORDER — CHLORHEXIDINE GLUCONATE 4 % EX LIQD: 60.0000 mL | Freq: Once | CUTANEOUS | Status: DC

## 2018-05-31 MED ORDER — OXYCODONE HCL 5 MG PO TABS: 5.0000 mg | ORAL_TABLET | Freq: Once | ORAL | Status: DC | PRN

## 2018-05-31 MED ORDER — OXYCODONE HCL 5 MG/5ML PO SOLN: 5.0000 mg | Freq: Once | ORAL | Status: DC | PRN

## 2018-05-31 MED ORDER — PROPOFOL 10 MG/ML IV BOLUS
INTRAVENOUS | Status: DC | PRN
  Administered 2018-05-31: 120 mg via INTRAVENOUS
  Administered 2018-05-31: 30 mg via INTRAVENOUS

## 2018-05-31 MED ORDER — LACTATED RINGERS IV SOLN
INTRAVENOUS | Status: DC | PRN
  Administered 2018-05-31: 07:00:00 via INTRAVENOUS

## 2018-05-31 MED ORDER — MIDAZOLAM HCL 5 MG/5ML IJ SOLN
INTRAMUSCULAR | Status: DC | PRN
  Administered 2018-05-31 (×2): 1 mg via INTRAVENOUS

## 2018-05-31 MED ORDER — ACETAMINOPHEN 160 MG/5ML PO SOLN: 1000.0000 mg | Freq: Once | ORAL | Status: DC | PRN

## 2018-05-31 SURGICAL SUPPLY — 69 items
BLADE AVERAGE 25MMX9MM (BLADE) ×1
BLADE AVERAGE 25X9 (BLADE) ×2 IMPLANT
BRUSH SCRUB SURG 4.25 DISP (MISCELLANEOUS) ×6 IMPLANT
CLOSURE WOUND 1/2 X4 (GAUZE/BANDAGES/DRESSINGS) ×1
COVER SURGICAL LIGHT HANDLE (MISCELLANEOUS) ×3 IMPLANT
COVER WAND RF STERILE (DRAPES) ×3 IMPLANT
DRAPE C-ARM 42X72 X-RAY (DRAPES) ×3 IMPLANT
DRAPE C-ARMOR (DRAPES) ×3 IMPLANT
DRAPE IMP U-DRAPE 54X76 (DRAPES) ×3 IMPLANT
DRAPE INCISE IOBAN 66X45 STRL (DRAPES) ×3 IMPLANT
DRAPE ORTHO SPLIT 77X108 STRL (DRAPES) ×2
DRAPE SURG ORHT 6 SPLT 77X108 (DRAPES) ×1 IMPLANT
DRAPE U-SHAPE 47X51 STRL (DRAPES) ×6 IMPLANT
DRSG EMULSION OIL 3X3 NADH (GAUZE/BANDAGES/DRESSINGS) ×3 IMPLANT
DRSG PAD ABDOMINAL 8X10 ST (GAUZE/BANDAGES/DRESSINGS) ×6 IMPLANT
ELECT CAUTERY BLADE 6.4 (BLADE) IMPLANT
ELECT NEEDLE TIP 2.8 STRL (NEEDLE) ×3 IMPLANT
ELECT REM PT RETURN 9FT ADLT (ELECTROSURGICAL) ×3
ELECTRODE REM PT RTRN 9FT ADLT (ELECTROSURGICAL) ×1 IMPLANT
GAUZE SPONGE 4X4 12PLY STRL (GAUZE/BANDAGES/DRESSINGS) ×3 IMPLANT
GLOVE BIO SURGEON STRL SZ7.5 (GLOVE) ×3 IMPLANT
GLOVE BIO SURGEON STRL SZ8 (GLOVE) ×3 IMPLANT
GLOVE BIOGEL PI IND STRL 7.5 (GLOVE) ×1 IMPLANT
GLOVE BIOGEL PI IND STRL 8 (GLOVE) ×1 IMPLANT
GLOVE BIOGEL PI INDICATOR 7.5 (GLOVE) ×2
GLOVE BIOGEL PI INDICATOR 8 (GLOVE) ×2
GOWN STRL REUS W/ TWL LRG LVL3 (GOWN DISPOSABLE) ×2 IMPLANT
GOWN STRL REUS W/ TWL XL LVL3 (GOWN DISPOSABLE) ×1 IMPLANT
GOWN STRL REUS W/TWL LRG LVL3 (GOWN DISPOSABLE) ×4
GOWN STRL REUS W/TWL XL LVL3 (GOWN DISPOSABLE) ×2
KIT AC JOINT DISP (KITS) ×3 IMPLANT
KIT BASIN OR (CUSTOM PROCEDURE TRAY) ×3 IMPLANT
KIT TURNOVER KIT B (KITS) ×3 IMPLANT
MANIFOLD NEPTUNE II (INSTRUMENTS) ×3 IMPLANT
NDL SUT 2 .5 CRC MAYO 1.732X (NEEDLE) IMPLANT
NEEDLE HYPO 25GX1X1/2 BEV (NEEDLE) ×3 IMPLANT
NEEDLE MAYO TAPER (NEEDLE)
NS IRRIG 1000ML POUR BTL (IV SOLUTION) ×3 IMPLANT
PACK SHOULDER (CUSTOM PROCEDURE TRAY) ×3 IMPLANT
PACK TOTAL JOINT (CUSTOM PROCEDURE TRAY) ×3 IMPLANT
PACK UNIVERSAL I (CUSTOM PROCEDURE TRAY) ×3 IMPLANT
PAD ARMBOARD 7.5X6 YLW CONV (MISCELLANEOUS) ×6 IMPLANT
SLING ARM FOAM STRAP LRG (SOFTGOODS) ×3 IMPLANT
SPONGE LAP 18X18 X RAY DECT (DISPOSABLE) ×6 IMPLANT
STAPLER VISISTAT 35W (STAPLE) IMPLANT
STRIP CLOSURE SKIN 1/2X4 (GAUZE/BANDAGES/DRESSINGS) ×2 IMPLANT
SUCTION FRAZIER HANDLE 10FR (MISCELLANEOUS) ×2
SUCTION TUBE FRAZIER 10FR DISP (MISCELLANEOUS) ×1 IMPLANT
SUT BONE WAX W31G (SUTURE) ×3 IMPLANT
SUT ETHIBOND 2 OS 4 DA (SUTURE) IMPLANT
SUT ETHIBOND NAB BRD #0 18IN (SUTURE) IMPLANT
SUT ETHILON 3 0 PS 1 (SUTURE) ×3 IMPLANT
SUT ETHILON 4 0 PS 2 18 (SUTURE) IMPLANT
SUT MNCRL AB 4-0 PS2 18 (SUTURE) ×3 IMPLANT
SUT MON AB 4-0 PC3 18 (SUTURE) ×3 IMPLANT
SUT PROLENE 3 0 PS 1 (SUTURE) ×3 IMPLANT
SUT VIC AB 0 CT1 27 (SUTURE) ×2
SUT VIC AB 0 CT1 27XBRD ANBCTR (SUTURE) ×1 IMPLANT
SUT VIC AB 0 CT2 27 (SUTURE) IMPLANT
SUT VIC AB 2-0 CT1 27 (SUTURE) ×2
SUT VIC AB 2-0 CT1 TAPERPNT 27 (SUTURE) ×1 IMPLANT
SUT VIC AB 2-0 CT3 27 (SUTURE) IMPLANT
SUT VIC AB 2-0 FS1 27 (SUTURE) IMPLANT
SYR CONTROL 10ML LL (SYRINGE) ×3 IMPLANT
TOWEL OR 17X24 6PK STRL BLUE (TOWEL DISPOSABLE) ×3 IMPLANT
TOWEL OR 17X26 10 PK STRL BLUE (TOWEL DISPOSABLE) ×3 IMPLANT
WATER STERILE IRR 1000ML POUR (IV SOLUTION) IMPLANT
YANKAUER SUCT BULB TIP NO VENT (SUCTIONS) ×3 IMPLANT
ZIPLOOP AC JOINT REPAIR (Orthopedic Implant) ×3 IMPLANT

## 2018-05-31 NOTE — Discharge Instructions (Signed)
Keep splint intact and dry

## 2018-05-31 NOTE — Transfer of Care (Signed)
Immediate Anesthesia Transfer of Care Note  Patient: [name]  Procedure(s) Performed: ACROMIO-CLAVICULAR JOINT REPAIR (Right Shoulder) RESECTION DISTAL CLAVICAL (Right Shoulder)  Patient Location: PACU  Anesthesia Type:General and Regional  Level of Consciousness: awake, alert , oriented and sedated  Airway & Oxygen Therapy: Patient Spontanous Breathing and Patient connected to nasal cannula oxygen  Post-op Assessment: Report given to RN, Post -op Vital signs reviewed and stable and Patient moving all extremities  Post vital signs: Reviewed and stable  Last Vitals:  Vitals Value Taken Time  BP 147/77 05/31/2018 10:07 AM  Temp    Pulse 64 05/31/2018 10:08 AM  Resp 19 05/31/2018 10:08 AM  SpO2 100 % 05/31/2018 10:08 AM  Vitals shown include unvalidated device data.  Last Pain:  Vitals:   05/31/18 0651  TempSrc:   PainSc: 0-No pain      Patients Stated Pain Goal: 3 (05/31/18 [id number])  Complications: No apparent anesthesia complications

## 2018-05-31 NOTE — Anesthesia Postprocedure Evaluation (Signed)
Anesthesia Post Note  Patient: Associate ProfessorLevern Ost  Procedure(s) Performed: ACROMIO-CLAVICULAR JOINT REPAIR (Right Shoulder) RESECTION DISTAL CLAVICAL (Right Shoulder)     Patient location during evaluation: PACU Anesthesia Type: Regional and General Level of consciousness: awake and alert Pain management: pain level controlled Vital Signs Assessment: post-procedure vital signs reviewed and stable Respiratory status: spontaneous breathing, nonlabored ventilation, respiratory function stable and patient connected to nasal cannula oxygen Cardiovascular status: blood pressure returned to baseline and stable Postop Assessment: no apparent nausea or vomiting Anesthetic complications: no    Last Vitals:  Vitals:   05/31/18 1125 05/31/18 1137  BP: (!) 151/82 (!) 162/79  Pulse: (!) 48 (!) 53  Resp: 13 12  Temp:    SpO2: 100% 100%    Last Pain:  Vitals:   05/31/18 1137  TempSrc:   PainSc: 0-No pain                 Michele Kerlin

## 2018-05-31 NOTE — Anesthesia Procedure Notes (Signed)

## 2018-05-31 NOTE — Anesthesia Preprocedure Evaluation (Signed)
Anesthesia Evaluation  Patient identified by MRN, date of birth, ID band Patient awake    Reviewed: Allergy & Precautions, NPO status , Patient's Chart, lab work & pertinent test results  History of Anesthesia Complications Negative for: history of anesthetic complications  Airway Mallampati: II  TM Distance: >3 FB Neck ROM: Full    Dental  (+) Teeth Intact   Pulmonary neg shortness of breath, neg sleep apnea, neg COPD, neg recent URI, Current Smoker,    breath sounds clear to auscultation       Cardiovascular + Peripheral Vascular Disease   Rhythm:Regular     Neuro/Psych negative neurological ROS  negative psych ROS   GI/Hepatic negative GI ROS, Neg liver ROS,   Endo/Other  negative endocrine ROS  Renal/GU negative Renal ROS     Musculoskeletal right acromioclavicular separation and joint arthritis   Abdominal   Peds  Hematology negative hematology ROS (+)   Anesthesia Other Findings   Reproductive/Obstetrics                             Anesthesia Physical Anesthesia Plan  ASA: II  Anesthesia Plan: General and Regional   Post-op Pain Management:  Regional for Post-op pain   Induction:   PONV Risk Score and Plan: 1 and Ondansetron and Dexamethasone  Airway Management Planned: Oral ETT  Additional Equipment: None  Intra-op Plan:   Post-operative Plan: Extubation in OR  Informed Consent: I have reviewed the patients History and Physical, chart, labs and discussed the procedure including the risks, benefits and alternatives for the proposed anesthesia with the patient or authorized representative who has indicated his/her understanding and acceptance.   Dental advisory given  Plan Discussed with: CRNA and Surgeon  Anesthesia Plan Comments:         Anesthesia Quick Evaluation

## 2018-05-31 NOTE — Anesthesia Procedure Notes (Signed)
Anesthesia Regional Block: Interscalene brachial plexus block   Pre-Anesthetic Checklist: ,, timeout performed, Correct Patient, Correct Site, Correct Laterality, Correct Procedure, Correct Position, site marked, Risks and benefits discussed,  Surgical consent,  Pre-op evaluation,  At surgeon's request and post-op pain management  Laterality: Right and Upper  Prep: chloraprep       Needles:  Injection technique: Single-shot     Needle Length: 9cm  Needle Gauge: 22     Additional Needles: Arrow StimuQuik ECHO Echogenic Stimulating PNB Needle  Procedures:,,,, ultrasound used (permanent image in chart),,,,  Narrative:  Start time: 05/31/2018 7:28 AM End time: 05/31/2018 7:32 AM Injection made incrementally with aspirations every 5 mL.  Performed by: Personally  Anesthesiologist: Val EagleMoser, Mattalyn Anderegg, MD  Additional Notes: Right superficial cervical plexus block

## 2018-06-01 ENCOUNTER — Encounter (HOSPITAL_COMMUNITY): Payer: Self-pay | Admitting: Orthopedic Surgery

## 2018-06-04 NOTE — Op Note (Signed)
[place], [name] MEDICAL RECORD ZO:[id number] ACCOUNT [id number] DATE OF BIRTH:[birth year] FACILITY: [facility] LOCATION: [facility] PHYSICIAN:Clerance Umland H. Chritopher Coster, MD  OPERATIVE REPORT  DATE OF PROCEDURE:  05/31/2018  PREOPERATIVE DIAGNOSIS:  Gross instability status post right shoulder acromioclavicular  joint separation.  POSTOPERATIVE DIAGNOSIS:  Gross instability status post right shoulder acromioclavicular joint separation.  PROCEDURES: 1.  Right distal clavicle excision. 2.  Repair of right shoulder acromioclavicular  joint separation.  SURGEON:  Myrene Galas, MD  ASSISTANT:  PA student.  ANESTHESIA:  General, supplemented with regional block.  COMPLICATIONS:  None.  ESTIMATED BLOOD LOSS:  Less than 100 mL.  PATIENT DISPOSITION:  To PACU.  CONDITION:  Stable.  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  This involves a significant amount of backpack use for blower and other devices.  The patient has continued to have significant  pain and instability of the right distal clavicle.  Following AC joint dislocation with greater than 300% in place.  I discussed with the patient risks and benefits of surgery including the possibility of infection, nerve injury, vessel injury,  disruption of the repair, loss of motion and need for further surgery, among others.  After full discussion, the patient did wish to proceed.  SUMMARY OF PROCEDURE:  The patient was taken to the operating room where general anesthesia was induced.  He did receive a preoperative block.  His right upper extremity was prepped and draped in the usual sterile fashion.   After a timeout, a 4 cm  saber-type incision was made at the tip of the distal clavicle.  I carried dissection carefully down to the [facility] joint where encountered a good deal of fluid.  Again, there was significant instability of the distal clavicle.  The tip of the clavicle  was  easily identifiable and then the terminal 1 cm excised.  I was careful to protect the underlying cuff with Cobb retractors during this excision.  I then brought in the C-arm to confirm position for placement of the TightRope tension device, and also to evaluate for osteophytes on the undersurface of the clavicle.  These were present and an osteotome and rasp were used to get them back to a smooth surface that would not impinge on the rotator cuff.  I then secured a pin in  the proper position for the TightRope device advanced this into the coracoid and then used the threaded drill to go over this, obtaining bicortical purchase in the coracoid, the TightRope device was advanced to the undersurface of the coracoid and  engaged on the far cortex and the clavicle tensioned into position, checking this on multiple views with C-arm.  It  was then tied over the button.  The other [facility] joint tissues were then imbricated over top of the clavicle and this included then a deep  fascial layer with a 0 Vicryl above this capsular layer with a #1 of figure-of-eight Vicryl, then 2-0 Vicryl and lastly Monocryl and Steri-Strips for the skin.  Sterile gently compressive dressing was applied.  The wound was irrigated thoroughly prior to  closure PA-C did assist me throughout the case.    The patient was placed in a sling, taken to the PACU in stable condition.  PROGNOSIS:  The patient will have a gentle pendulum range of motion of the right shoulder, unrestricted range of motion of the elbow with no lifting greater than 2 pounds.  We will plan to see  him back in the office for a wound check in 10 to 14 days  with gradual resumption of activity.  AN/NUANCE  D:06/03/2018 T:06/04/2018 JOB:[id number]

## 2019-10-15 ENCOUNTER — Encounter: Payer: Self-pay | Admitting: Family Medicine

## 2020-03-29 IMAGING — CT CT ABD-PELV W/ CM
2 of 5 series · 13 of 46 positions shown, 15 images · IV contrast (APPLIED)
Comparison: None

CLINICAL DATA: Trauma, was working on a truck on an incline,
removed blocks under truck, truck rolled down inclines and over
patient dragging him 20 feet, puncture wound LEFT lower flank,
abrasions LEFT chest, penetrating chest trauma

EXAM:
CT CHEST, ABDOMEN, AND PELVIS WITH CONTRAST
TECHNIQUE: Multidetector CT imaging of the chest, abdomen and pelvis was
performed following the standard protocol during bolus
administration of intravenous contrast. Sagittal and coronal MPR
images reconstructed from axial data set.
CONTRAST:  100mL UZHGDH-3UU IOPAMIDOL (UZHGDH-3UU) INJECTION 61% IV.
No oral contrast.

[Series 3: cap 5.0 i31f 2 · axial · 0.93mm/px · z∈[-830,-290]mm · 10 of 132 slices shown, 12 images]
[im 12/132  soft-tissue]
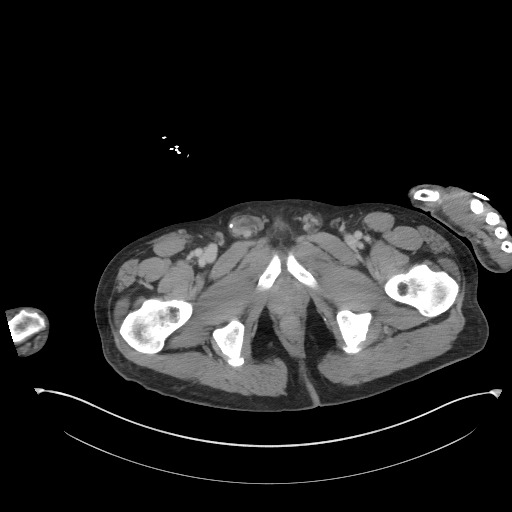
[im 12/132  bone]
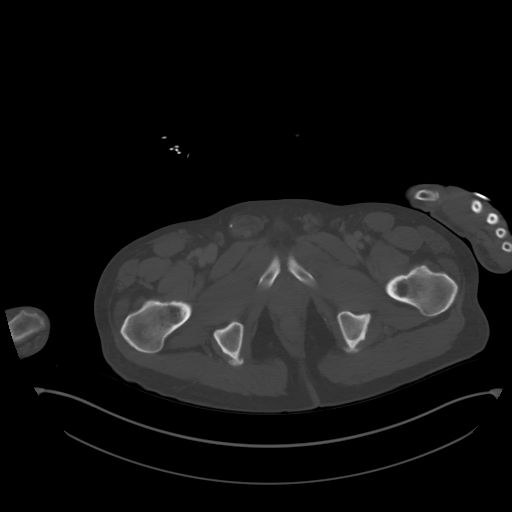
[im 24/132  soft-tissue]
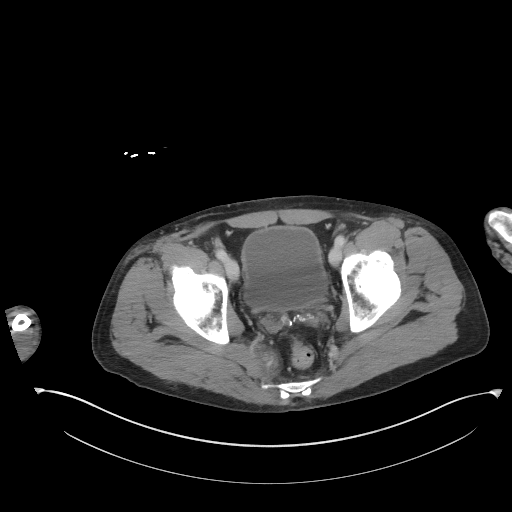
[im 36/132  soft-tissue]
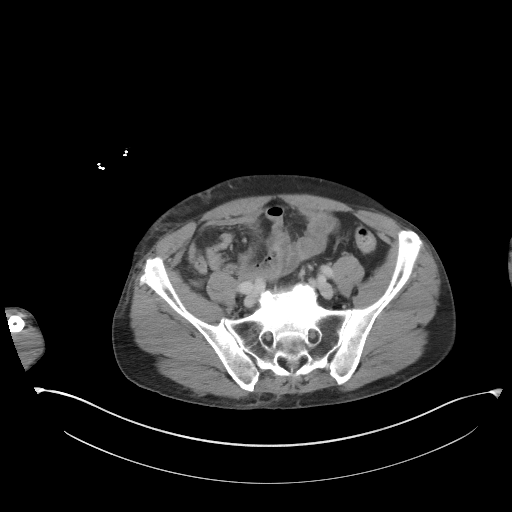
[im 48/132  soft-tissue]
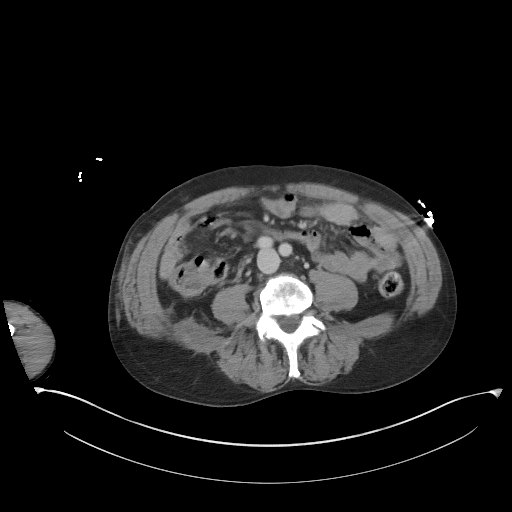
[im 60/132  soft-tissue]
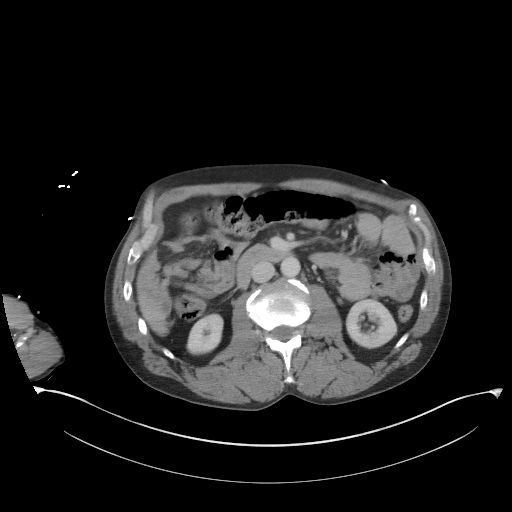
[im 72/132  soft-tissue]
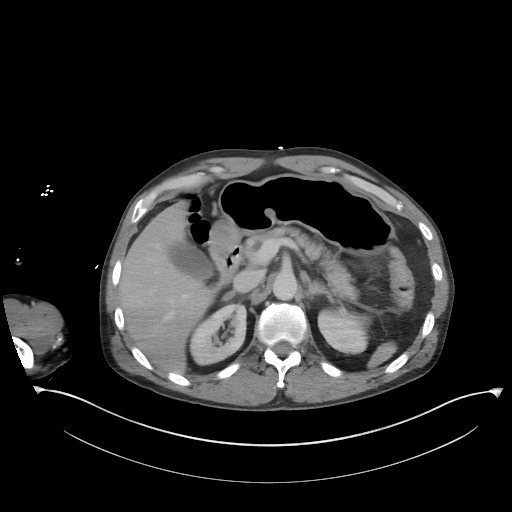
[im 84/132  soft-tissue]
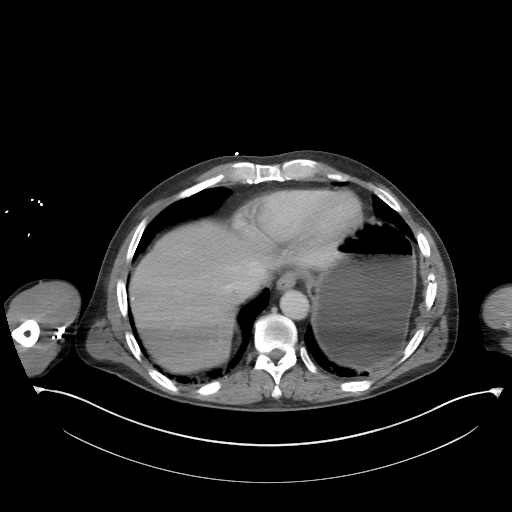
[im 96/132  soft-tissue]
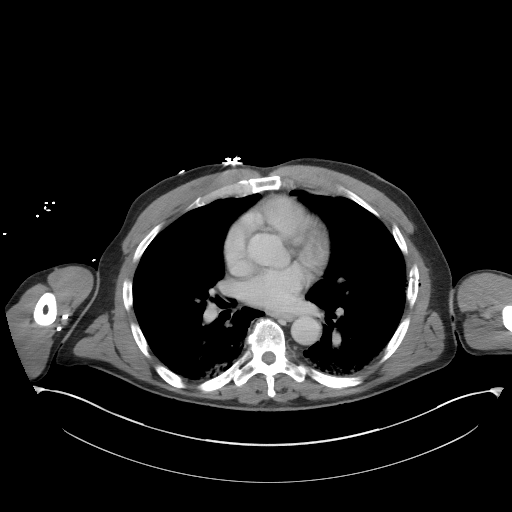
[im 108/132  soft-tissue]
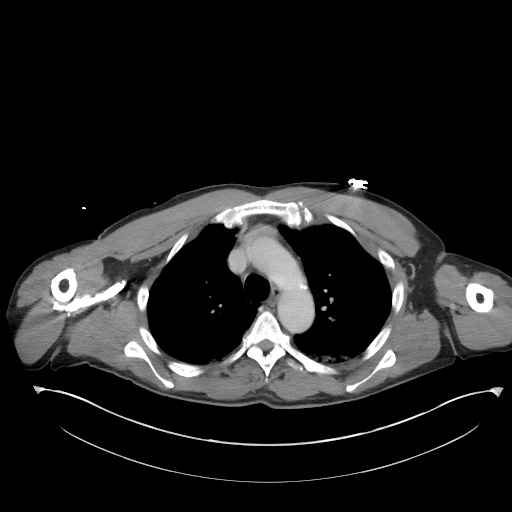
[im 108/132  bone]
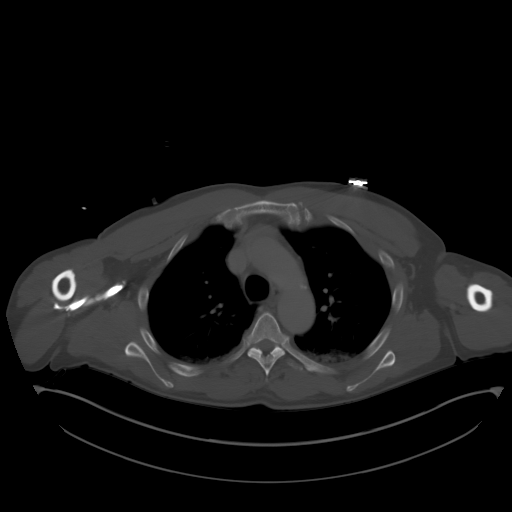
[im 120/132  soft-tissue]
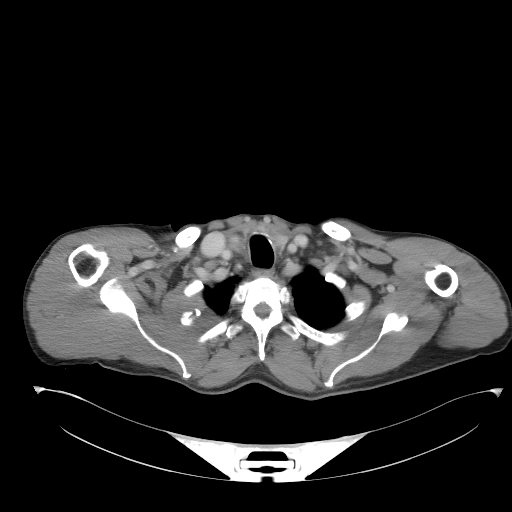

[Series 6: coronal · coronal · 0.93mm/px · 3 of 119 slices shown]
[im 40/119  soft-tissue]
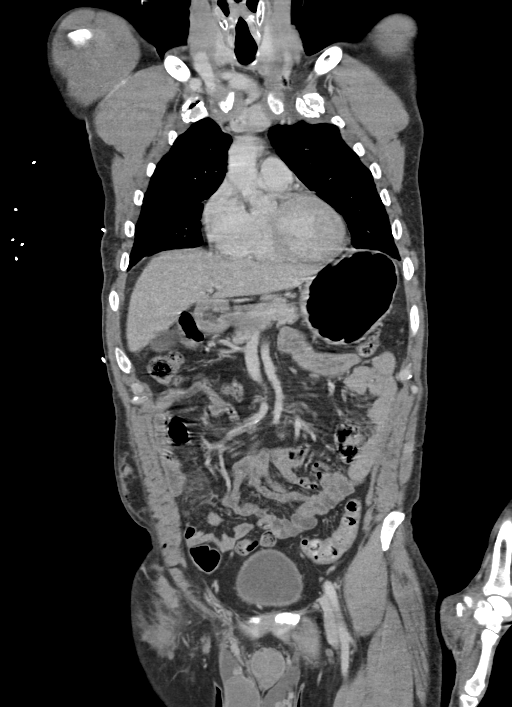
[im 53/119  soft-tissue]
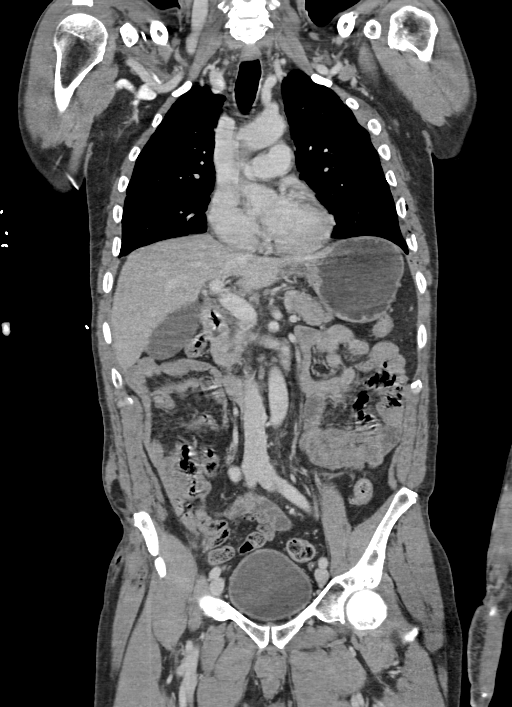
[im 66/119  soft-tissue]
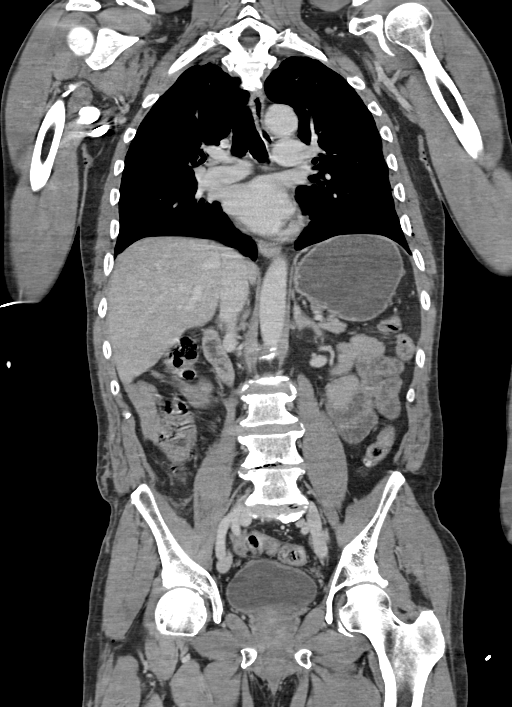

[13 of 46 positions shown; findings below may reference images not displayed]

FINDINGS: CT CHEST FINDINGS

Cardiovascular: Vascular structures grossly patent on non targeted
exam. Minimal atherosclerotic calcification aorta and at bifurcation
of brachiocephalic artery. Aorta normal caliber. No pericardial
effusion. Foci of gas within LEFT jugular vein likely related IV
access.

Mediastinum/Nodes: Esophagus normal appearance. Base of cervical
region normal appearance. No thoracic adenopathy. Foci of gas are
identified within the mediastinum anterior to the heart and at the
LEFT cardiophrenic angle.

Lungs/Pleura: Dependent atelectasis in the posterior lungs
bilaterally. Question minimal ground-glass infiltrate at the apices.
No segmental consolidation, pleural effusion, or pneumothorax.

Musculoskeletal: Fractures of the lateral LEFT fourth fifth sixth
seventh eighth and ninth ribs. Fractures of the posterolateral RIGHT
second and third ribs. Tiny foci of chest wall gas lateral LEFT mid
chest. Degenerative disc disease changes lower cervical and lower
thoracic spine. No vertebral or sternal fractures.

CT ABDOMEN PELVIS FINDINGS

Hepatobiliary: Gallbladder and liver normal appearance

Pancreas: Normal appearance

Spleen: Small, otherwise normal appearance

Adrenals/Urinary Tract: Adrenal glands normal appearance. Small cyst
inferior pole LEFT kidney. Kidneys, ureters, and bladder
unremarkable.

Stomach/Bowel: Stomach distended by fluid and gas, otherwise
unremarkable. Normal appendix. Large and small bowel loops
unremarkable.

Vascular/Lymphatic: Aorta normal caliber. Vascular structures
grossly patent. No adenopathy. Few pelvic phleboliths.

Reproductive: Minimal prostatic enlargement.  BILATERAL hydroceles.

Other: No free intraperitoneal air or fluid RIGHT inguinal hernia.

Musculoskeletal: Degenerative disc disease changes at lower lumbar
spine, multilevel. Lumbar vertebral body heights maintained.
Fractures of the RIGHT transverse processes of L1, L2, and L3. RIGHT
posterior pararenal space hematoma and overlying muscular injury of
the RIGHT quadratus lumborum just above the RIGHT iliac crest with
associated stranding. Subcutaneous contusion/hemorrhage at RIGHT
flank and anterolateral upper pelvis. Foci of soft tissue gas within
the subcutaneous fat at the RIGHT lower quadrant, anterior to the
RIGHT iliac crest, extending to RIGHT inguinal region and lateral
RIGHT hip. Edema identified along the medial border of the RIGHT
piriformis muscle consistent with injury. Subcutaneous edema
identified dorsal to the RIGHT gluteal muscles. Several small bone
fragments are seen anterior to the RIGHT iliac bone at the anterior
superior iliac spine question avulsion injury of the RIGHT sartorius
muscle.
IMPRESSION: BILATERAL rib fractures, at RIGHT posterolateral second and third
ribs and LEFT lateral fourth through ninth ribs.

Dependent atelectasis in both lungs without pneumothorax or
effusion.

Small foci of gas are seen in the anterior mediastinum anterior to
the heart extending to the LEFT cardiophrenic angle.

Soft tissue injury to the RIGHT lateral abdominal wall extending to
the lateral RIGHT pelvis with subcutaneous infiltration,
subcutaneous hematoma, and foci of soft tissue gas.

Additional hemorrhage at the RIGHT quadratus lumborum and posterior
pararenal space as well as at the medial margin of the RIGHT
piriformis muscle and superficial to the RIGHT gluteal muscles.

Question avulsion fracture at the RIGHT anterior superior iliac
spine at the sartorius origin.

Fractures of RIGHT transverse processes of L1, L2, and L3.

RIGHT inguinal hernia.

Small BILATERAL hydroceles.

Mild prostatic enlargement.

Minimal nonspecific ground-glass opacity at the lung apices.

Findings called to Dr.  Ferienhaus on 03/20/2018 at 5588 hours.

## 2020-03-30 IMAGING — DX DG CHEST 1V PORT
1 series · 1 of 1 positions shown · non-contrast
Comparison: CT chest 03/20/2018

CLINICAL DATA: Shortness of breath

EXAM:
PORTABLE CHEST 1 VIEW

[chest]
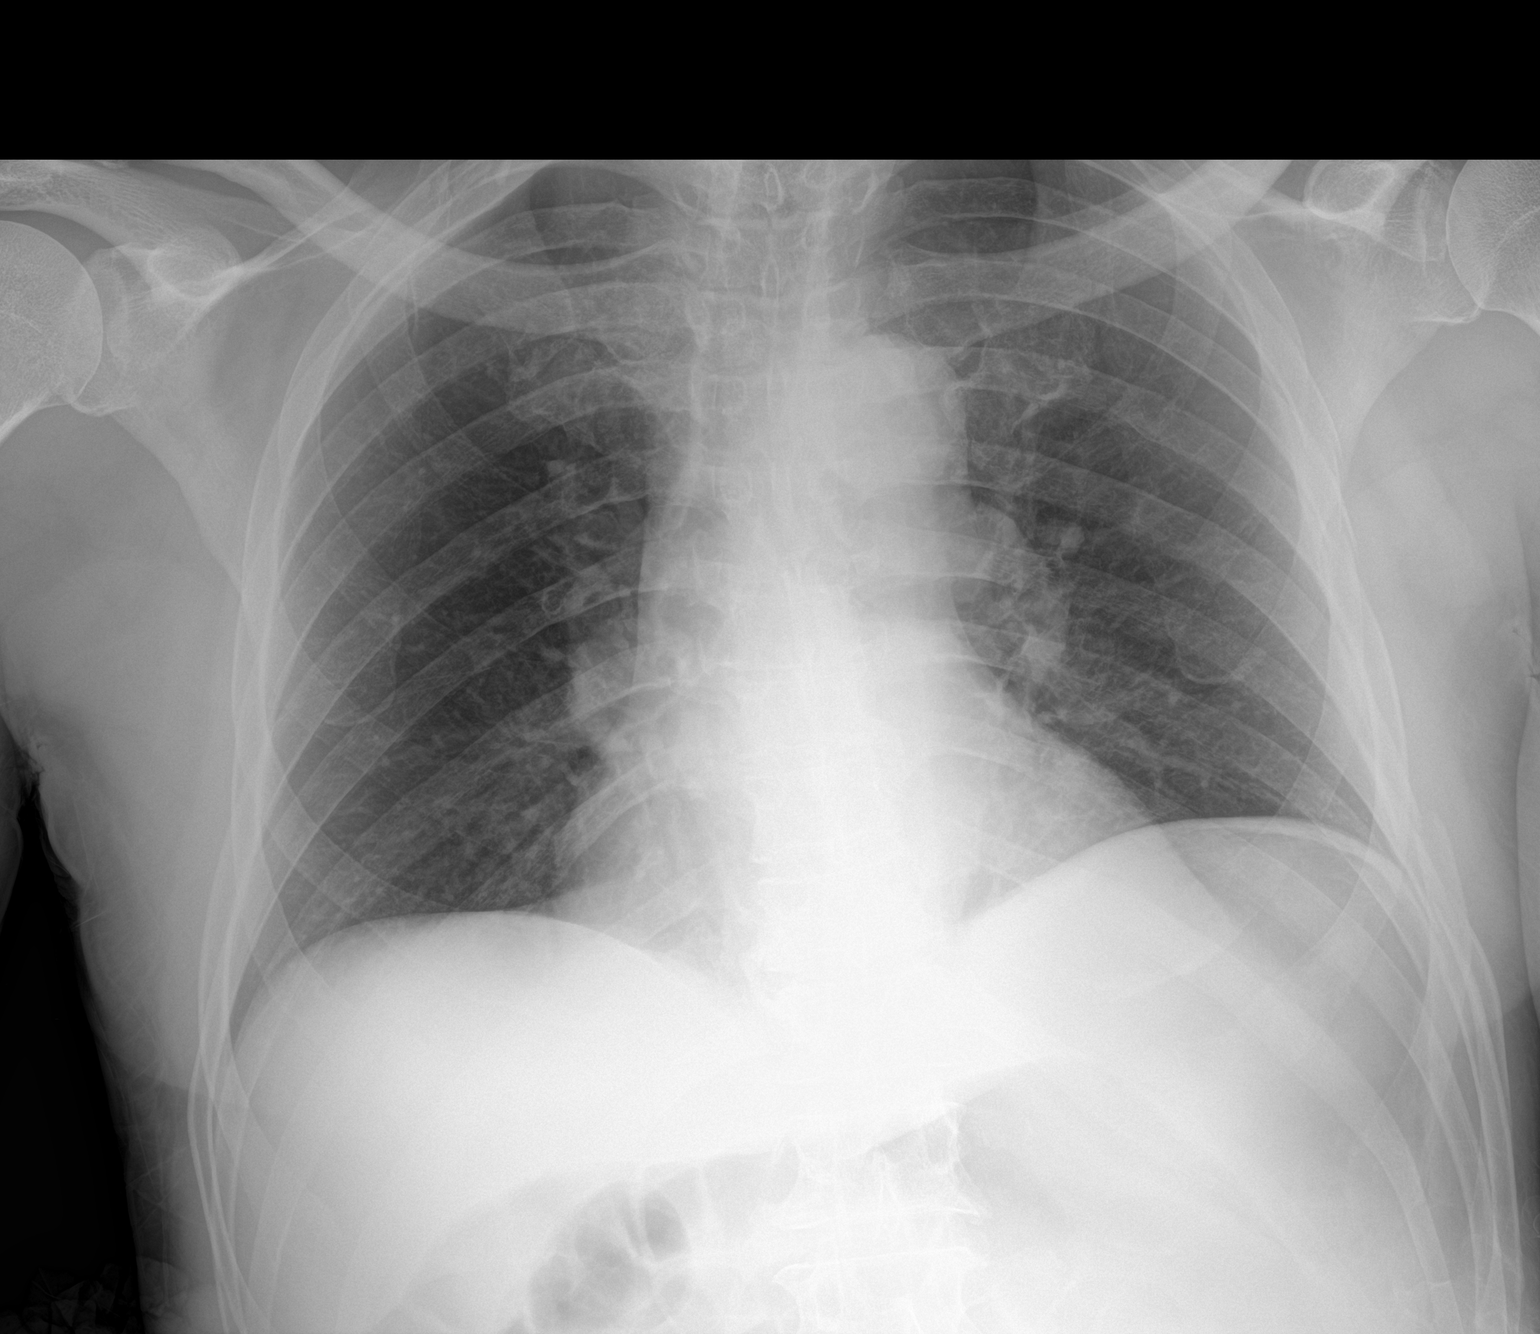

[1 of 1 positions shown; findings below may reference images not displayed]

FINDINGS: 7673 hours. Low volumes. The lungs are clear without focal
pneumonia, edema, pneumothorax or pleural effusion.
Cardiopericardial silhouette is at upper limits of normal for size.
Multiple upper right and lower left rib fractures evident..
IMPRESSION: Bilateral rib fractures. No pneumothorax or substantial pleural
effusion at this time.
